# Patient Record
Sex: Female | Born: 1970 | Race: White | Hispanic: Yes | Marital: Married | Smoking: Never smoker
Health system: Southern US, Community
[De-identification: ages and names within clinical notes are randomized; demographics above are authoritative.]

## PROBLEM LIST (undated history)

## (undated) DIAGNOSIS — Z789 Other specified health status: Secondary | ICD-10-CM

## (undated) HISTORY — DX: Other specified health status: Z78.9

## (undated) HISTORY — PX: NO PAST SURGERIES: SHX2092

---

## 2004-06-11 ENCOUNTER — Emergency Department (HOSPITAL_COMMUNITY): Admission: EM | Admit: 2004-06-11 | Discharge: 2004-06-11 | Payer: Self-pay | Admitting: Emergency Medicine

## 2007-12-11 ENCOUNTER — Other Ambulatory Visit: Admission: RE | Admit: 2007-12-11 | Discharge: 2007-12-11 | Payer: Self-pay | Admitting: Obstetrics and Gynecology

## 2018-01-11 DIAGNOSIS — N6489 Other specified disorders of breast: Secondary | ICD-10-CM | POA: Insufficient documentation

## 2020-09-03 ENCOUNTER — Encounter: Payer: Self-pay | Admitting: Obstetrics and Gynecology

## 2020-09-14 NOTE — Progress Notes (Signed)
Subjective:    Mckenzie Blair - 50 y.o. female MRN [id number]  Date of birth: 06-Jan-1971  HPI  Mckenzie Blair is to establish care. Patient has a PMH significant for anemia and breast asymmetry.   Current issues and/or concerns: Reports she has an IUD in place, doing well without complications, and scheduled to be removed within the next 1 year. States IUD was placed to assist with management of heavy periods which caused anemia. Has not had period in 4 years. Would like to have hormone levels evaluated by Gynecology as she states her mother had early menopause in her early 16's. Reports if her hormone levels indicate that she is at menopause then she would not need to have IUD replaced.   ROS per HPI   Health Maintenance:  Health Maintenance Due  Topic Date Due  . Hepatitis C Screening  Never done  . HIV Screening  Never done  . COLONOSCOPY (Pts 45-49yrs Insurance coverage will need to be confirmed)  Never done     Past Medical History: Patient Active Problem List   Diagnosis Date Noted  . Anemia 09/15/2020  . Breast asymmetry 01/11/2018      Social History   reports that she has never smoked. She has never used smokeless tobacco. She reports that she does not drink alcohol and does not use drugs.   Family History  family history is not on file.   Medications: reviewed and updated   Objective:   Physical Exam BP 114/74 (BP Location: Left Arm, Patient Position: Sitting)   Pulse 82   Ht 5' 3.74" (1.619 m)   Wt 138 lb 12.8 oz (63 kg)   SpO2 97%   BMI 24.02 kg/m  Physical Exam Constitutional:      Appearance: She is normal weight.  HENT:     Head: Normocephalic.  Eyes:     Extraocular Movements: Extraocular movements intact.     Pupils: Pupils are equal, round, and reactive to light.  Cardiovascular:     Rate and Rhythm: Normal rate and regular rhythm.     Pulses: Normal pulses.     Heart sounds: Normal heart sounds.  Pulmonary:     Effort: Pulmonary effort is  normal.     Breath sounds: Normal breath sounds.  Musculoskeletal:     Cervical back: Normal range of motion and neck supple.  Neurological:     General: No focal deficit present.     Mental Status: She is alert and oriented to person, place, and time.  Psychiatric:        Mood and Affect: Mood normal.        Behavior: Behavior normal.        Assessment & Plan:  1. Encounter to establish care: - Patient presents today to establish care.  - Return in 4 to 6 weeks or sooner if needed for annual physical examination, labs, and health maintenance. Arrive fasting meaning having had no food and/or nothing to drink for at least 8 hours prior to appointment.  2. IUD check up: - Reports she has an IUD in place, doing well without complications, and scheduled to be removed within the next 1 year. States IUD was placed to assist with management of heavy periods which caused anemia. Has not had period in 4 years. Would like to have hormone levels evaluated by Gynecology as she states her mother had early menopause in her early 67's. Reports if her hormone levels indicate that she is at menopause then she  would not need to have IUD replaced. - Referral to Obstetrics / Gynecology for further evaluation and management. - Ambulatory referral to Obstetrics / Gynecology  3. Need for immunization against influenza: - Flu vaccine administered during today's visit.  - Flu Vaccine QUAD 36+ mos IM   Ricky Stabs, NP 09/16/2020, 8:21 AM Primary Care at [facility]

## 2020-09-15 ENCOUNTER — Encounter: Payer: Self-pay | Admitting: Family

## 2020-09-15 ENCOUNTER — Other Ambulatory Visit: Payer: Self-pay

## 2020-09-15 ENCOUNTER — Ambulatory Visit (INDEPENDENT_AMBULATORY_CARE_PROVIDER_SITE_OTHER): Payer: 59 | Admitting: Family

## 2020-09-15 VITALS — BP 114/74 | HR 82 | Ht 63.74 in | Wt 138.8 lb

## 2020-09-15 DIAGNOSIS — Z30431 Encounter for routine checking of intrauterine contraceptive device: Secondary | ICD-10-CM

## 2020-09-15 DIAGNOSIS — Z7689 Persons encountering health services in other specified circumstances: Secondary | ICD-10-CM

## 2020-09-15 DIAGNOSIS — D649 Anemia, unspecified: Secondary | ICD-10-CM | POA: Insufficient documentation

## 2020-09-15 DIAGNOSIS — Z0000 Encounter for general adult medical examination without abnormal findings: Secondary | ICD-10-CM

## 2020-09-15 DIAGNOSIS — Z23 Encounter for immunization: Secondary | ICD-10-CM | POA: Diagnosis not present

## 2020-09-15 NOTE — Patient Instructions (Signed)
Return in 4 to 6 weeks or sooner if needed for annual physical examination, labs, and health maintenance. Arrive fasting meaning having had no food and/or nothing to drink for at least 8 hours prior to appointment.  Referral to Obstetrics/Gynecology for IUD removal.  Thank you for choosing Primary Care at [facility] for your medical home!    Bionca Mckey was seen by Rema Fendt, NP today.   Alva Klumb's primary care provider is Rema Fendt, NP.   For the best care possible,  you should try to see Ricky Stabs, NP whenever you come to clinic.   We look forward to seeing you again soon!  If you have any questions about your visit today,  please call us at [phone]  Or feel free to reach your provider via MyChart.   Intrauterine Device Information An intrauterine device (IUD) is a medical device that is inserted into the uterus to prevent pregnancy. It is a small, T-shaped device that has one or two nylon strings hanging down from it. The strings hang out of the lower part of the uterus (cervix) to allow for future IUD removal. There are two types of IUDs:  Hormone IUD. This type of IUD is made of plastic and contains the hormone progestin (synthetic progesterone). A hormone IUD may last 3-5 years.  Copper IUD. This type of IUD has copper wire wrapped around it. A copper IUD may last up to 10 years. How is an IUD inserted? An IUD is inserted through the vagina, through the cervix, and into the uterus with a minor medical procedure. The procedure for IUD insertion may vary among health care providers and hospitals. How does an IUD work? Synthetic progesterone in a hormonal IUD prevents pregnancy by:  Thickening cervical mucus to prevent sperm from entering the uterus.  Thinning the uterine lining to prevent a fertilized egg from being implanted there. Copper in a copper IUD prevents pregnancy by making the uterus and fallopian tubes produce a fluid that kills  sperm. What are the advantages of an IUD? Advantages of either type of IUD An IUD:  Is highly effective in preventing pregnancy.  Is reversible. You can become pregnant shortly after the IUD is removed.  Is low-maintenance and can stay in place for a long time.  Has no estrogen-related side effects.  Can be used when breastfeeding.  Is not associated with weight gain.  Can be inserted right after childbirth, an abortion, or a miscarriage. Advantages of a hormone IUD  If it is inserted within 7 days of your period starting, it works right after it has been inserted. If the hormone IUD is inserted at any other time in your cycle, you will need to use a backup method of birth control for 7 days after insertion.  It can make menstrual periods lighter or stop completely.  It can reduce menstrual cramping and other discomforts from menstrual periods.  It can be used for 3-5 years, depending on which IUD you have. Advantages of a copper IUD  It works right after it is inserted.  It can be used as a form of emergency birth control if it is inserted within 5 days after having unprotected sex.  It does not interfere with your body's natural hormones.  It can be used for up to 10 years. What are the disadvantages of an IUD?  An IUD may cause irregular menstrual bleeding for a period of time after insertion.  It is common to have  pain during insertion and have cramping and vaginal bleeding after insertion.  An IUD may cut the uterus (uterine perforation) when it is inserted. This is rare.  Pelvic inflammatory disease (PID) may happen after insertion of an IUD. PID is an infection in the uterus and fallopian tubes. The IUD does not cause the infection. The infection is usually from an unknown sexually transmitted infection (STI). This is rare, and it usually happens during the first 20 days after the IUD is inserted.  A copper IUD can make your menstrual flow heavier and more  painful.  IUDs cannot prevent sexually transmitted infections (STIs). How is an IUD removed?   You will lie on your back with your knees bent and your feet in footrests (stirrups).  A device will be inserted into your vagina to spread apart the vaginal walls (speculum). This will allow your health care provider to see the strings attached to the IUD.  Your health care provider will use a small instrument (forceps) to grasp the IUD strings and will pull firmly until the IUD is removed. You may have some discomfort when the IUD is removed. Your health care provider may recommend taking over-the-counter pain relievers, such as ibuprofen, before the procedure. You may also have minor spotting for a few days after the procedure. The procedure for IUD removal may vary among health care providers and hospitals. Is an IUD right for me? If you are interested in an IUD, discuss it with your health care provider. He or she will make sure you are a good candidate for an IUD and will let you know more about the advantages, disadvantage, and possible side effects. This will allow you to make a decision about the device. Summary  An intrauterine device (IUD) is a medical device that is inserted in the uterus to prevent pregnancy. It is a small, T-shaped device that has one or two nylon strings hanging down from it.  A hormone IUD contains the hormone progestin (synthetic progesterone). A copper IUD has copper wire wrapped around it.  Synthetic progesterone in a hormone IUD prevents pregnancy by thickening cervical mucus and thinning the walls of the uterus. Copper in a copper IUD prevents pregnancy by making the uterus and fallopian tubes produce a fluid that kills sperm.  A hormone IUD can be left in place for 3-5 years. A copper IUD can be left in place for up to 10 years.  An IUD is inserted and removed by a health care provider. You may feel some pain during insertion and removal. Your health care  provider may recommend taking over-the-counter pain medicine, such as ibuprofen, before an IUD procedure. This information is not intended to replace advice given to you by your health care provider. Make sure you discuss any questions you have with your health care provider. Document Revised: 02/20/2020 Document Reviewed: 02/20/2020 Elsevier Patient Education  2021 [employer].

## 2020-09-15 NOTE — Progress Notes (Signed)
Establish care

## 2020-10-21 ENCOUNTER — Ambulatory Visit (INDEPENDENT_AMBULATORY_CARE_PROVIDER_SITE_OTHER): Payer: 59 | Admitting: Family

## 2020-10-21 ENCOUNTER — Other Ambulatory Visit: Payer: Self-pay

## 2020-10-21 ENCOUNTER — Encounter: Payer: Self-pay | Admitting: Family

## 2020-10-21 VITALS — BP 102/56 | HR 85 | Ht 63.74 in | Wt 137.2 lb

## 2020-10-21 DIAGNOSIS — E785 Hyperlipidemia, unspecified: Secondary | ICD-10-CM

## 2020-10-21 DIAGNOSIS — Z131 Encounter for screening for diabetes mellitus: Secondary | ICD-10-CM

## 2020-10-21 DIAGNOSIS — Z130 Encounter for screening for diseases of the blood and blood-forming organs and certain disorders involving the immune mechanism: Secondary | ICD-10-CM | POA: Diagnosis not present

## 2020-10-21 DIAGNOSIS — Z13228 Encounter for screening for other metabolic disorders: Secondary | ICD-10-CM

## 2020-10-21 DIAGNOSIS — Z1159 Encounter for screening for other viral diseases: Secondary | ICD-10-CM

## 2020-10-21 DIAGNOSIS — Z1211 Encounter for screening for malignant neoplasm of colon: Secondary | ICD-10-CM

## 2020-10-21 DIAGNOSIS — G5602 Carpal tunnel syndrome, left upper limb: Secondary | ICD-10-CM

## 2020-10-21 DIAGNOSIS — Z114 Encounter for screening for human immunodeficiency virus [HIV]: Secondary | ICD-10-CM

## 2020-10-21 DIAGNOSIS — H6121 Impacted cerumen, right ear: Secondary | ICD-10-CM

## 2020-10-21 DIAGNOSIS — Z1329 Encounter for screening for other suspected endocrine disorder: Secondary | ICD-10-CM

## 2020-10-21 DIAGNOSIS — Z0000 Encounter for general adult medical examination without abnormal findings: Secondary | ICD-10-CM | POA: Diagnosis not present

## 2020-10-21 DIAGNOSIS — Z13220 Encounter for screening for lipoid disorders: Secondary | ICD-10-CM

## 2020-10-21 MED ORDER — GABAPENTIN 100 MG PO CAPS: 100.0000 mg | ORAL_CAPSULE | Freq: Every day | ORAL | 0 refills | Status: DC

## 2020-10-21 MED ORDER — DEBROX 6.5 % OT SOLN: 5.0000 [drp] | Freq: Two times a day (BID) | OTIC | 0 refills | Status: DC

## 2020-10-21 NOTE — Progress Notes (Addendum)
Patient ID: Mckenzie Blair, female    DOB: 1970-12-15  MRN: [id number]  CC: Annual Physical Exam  Subjective: Mckenzie Blair is a 50 y.o. female who presents for annual physical exam.  Her concerns today include:   1. 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 Tylenol and Advil does help some. Using wrist splint. Is painful. No swelling. With bending the left thumb it does radiate to the wrist.   2. RIGHT EAR DISCOMFORT: Says she can hear her voice sometimes inside of her ear from the ear discomfort. Does use Q-tips. Denies pain and drainage.  Patient Active Problem List   Diagnosis Date Noted  . Anemia 09/15/2020  . Breast asymmetry 01/11/2018     Current Outpatient Medications on File Prior to Visit  Medication Sig Dispense Refill  . cetirizine-pseudoephedrine (ZYRTEC-D) 5-120 MG tablet Take by mouth.    . levonorgestrel (MIRENA) 20 MCG/24HR IUD by Intrauterine route.     No current facility-administered medications on file prior to visit.    No Known Allergies  Social History   Socioeconomic History  . Marital status: Married    Spouse name: Not on file  . Number of children: Not on file  . Years of education: Not on file  . Highest education level: Not on file  Occupational History  . Not on file  Tobacco Use  . Smoking status: Never Smoker  . Smokeless tobacco: Never Used  Vaping Use  . Vaping Use: Never used  Substance and Sexual Activity  . Alcohol use: Never  . Drug use: Never  . Sexual activity: Yes    Birth control/protection: I.U.D.  Other Topics Concern  . Not on file  Social History Narrative  . Not on file   Social Determinants of Health   Financial Resource Strain: Not on file  Food Insecurity: Not on file  Transportation Needs: Not on file  Physical Activity: Not on file  Stress: Not on file  Social Connections: Not on file  Intimate Partner  Violence: Not on file    History reviewed. No pertinent family history.  Past Surgical History:  Procedure Laterality Date  . NO PAST SURGERIES      ROS: Review of Systems Negative except as stated above  PHYSICAL EXAM: BP (!) 102/56 (BP Location: Left Arm, Patient Position: Sitting)   Pulse 85   Ht 5' 3.74" (1.619 m)   Wt 137 lb 3.2 oz (62.2 kg)   SpO2 98%   BMI 23.74 kg/m    Wt Readings from Last 3 Encounters:  10/21/20 137 lb 3.2 oz (62.2 kg)  09/15/20 138 lb 12.8 oz (63 kg)   Physical Exam Constitutional:      Appearance: She is normal weight.  HENT:     Head: Normocephalic and atraumatic.     Right Ear: Tympanic membrane normal. There is impacted cerumen.     Left Ear: Tympanic membrane, ear canal and external ear normal.     Nose: Nose normal.     Mouth/Throat:     Mouth: Mucous membranes are moist.     Pharynx: Oropharynx is clear. No oropharyngeal exudate.  Eyes:     Extraocular Movements: Extraocular movements intact.     Conjunctiva/sclera: Conjunctivae normal.     Pupils: Pupils are equal, round, and reactive to light.  Cardiovascular:     Rate and Rhythm: Normal rate and regular  rhythm.     Pulses: Normal pulses.     Heart sounds: Normal heart sounds.  Pulmonary:     Effort: Pulmonary effort is normal.     Breath sounds: Normal breath sounds.  Chest:  Breasts:     Right: Normal.     Left: Normal.      Comments: Margorie John, CMA present during examination.  Abdominal:     General: Abdomen is flat. Bowel sounds are normal.     Palpations: Abdomen is soft.  Genitourinary:    Comments: Patient declined examination.  Musculoskeletal:        General: Normal range of motion.     Left hand: Tenderness present.     Cervical back: Normal range of motion and neck supple.     Comments: Tenderness located at the left thumb palmar aspect.  Skin:    General: Skin is warm and dry.     Capillary Refill: Capillary refill takes less than 2 seconds.   Neurological:     General: No focal deficit present.     Mental Status: She is alert and oriented to person, place, and time.  Psychiatric:        Mood and Affect: Mood normal.        Behavior: Behavior normal.    ASSESSMENT AND PLAN: 1. Annual physical exam: - Counseled on 150 minutes of exercise per week as tolerated, healthy eating (including decreased daily intake of saturated fats, cholesterol, added sugars, sodium), STI prevention, and routine healthcare maintenance.  2. Screening for metabolic disorder: - CMP to check kidney function, liver function, and electrolyte balance.  - Comprehensive metabolic panel  3. Screening for deficiency anemia: - CBC to screen for anemia. - CBC  4. Diabetes mellitus screening: - Hemoglobin A1c to screen for pre-diabetes/diabetes. - Hemoglobin A1c  5. Screening cholesterol level: - Lipid panel to screen for high cholesterol.  - Lipid Panel  6. Thyroid disorder screen: - TSH to check thyroid function.  - TSH+T4F+T3Free  7. Need for hepatitis C screening test: - HCV antibody to screen for hepatitis C.  - HCV Ab w/Rflx to Verification  8. Encounter for screening for HIV: - HIV antibody to screen for human immunodeficiency virus.  - HIV antibody (with reflex)  9. Colon cancer screening: - Patient declined colonoscopy.  - Patient agreeable to Cologuard testing for colon cancer screening.   10. Carpal tunnel syndrome of left wrist: - Left thumb and wrist discomfort likely related to carpal tunnel syndrome. - Continue over-the-counter Tylenol and Advil.  - Continue wrist splint.  - Gabapentin as prescribed.  - Follow-up in 1 month or sooner if needed with primary provider.  - gabapentin (NEURONTIN) 100 MG capsule; Take 1 capsule (100 mg total) by mouth at bedtime.  Dispense: 30 capsule; Refill: 0  11. Right ear impacted cerumen: - Carbamide Peroxide as prescribed.  - Return in 1 week or sooner if needed for irrigation with the  nurse.   - carbamide peroxide (DEBROX) 6.5 % OTIC solution; Place 5 drops into the right ear 2 (two) times daily. Do not use more than 4 days in a row.  Dispense: 15 mL; Refill: 0  Patient was given the opportunity to ask questions.  Patient verbalized understanding of the plan and was able to repeat key elements of the plan. Patient was given clear instructions to go to Emergency Department or return to medical center if symptoms don't improve, worsen, or new problems develop.The patient verbalized understanding.   Orders  Placed This Encounter  Procedures  . Comprehensive metabolic panel  . CBC  . Hemoglobin A1c  . Lipid Panel  . TSH+T4F+T3Free  . HCV Ab w/Rflx to Verification  . HIV antibody (with reflex)  . Ambulatory referral to Gastroenterology     Requested Prescriptions   Signed Prescriptions Disp Refills  . gabapentin (NEURONTIN) 100 MG capsule 30 capsule 0    Sig: Take 1 capsule (100 mg total) by mouth at bedtime.  . carbamide peroxide (DEBROX) 6.5 % OTIC solution 15 mL 0    Sig: Place 5 drops into the right ear 2 (two) times daily. Do not use more than 4 days in a row.    Return in about 4 weeks (around 11/18/2020) for Follow-Up carpal tunnel, and 1 week for nurse only visit right ear irrigation.  Rema Fendt, NP

## 2020-10-21 NOTE — Progress Notes (Signed)
Physical Left thumb pain when bending backwards Feels like water in right ear

## 2020-10-21 NOTE — Patient Instructions (Addendum)
Annual physical exam and labs today. Will call with results.   Gabapentin for carpal tunnel syndrome. Continue Tylenol and Advil over-the-counter as well. Follow-up in 1 month.   Debrox ear drops for right ear wax. Return in 1 week for nurse visit to irrigate right ear.   Cologuard test for colon cancer screening.   Follow-up with primary provider as needed.   Preventive Care 50-50 Years Old, Female Preventive care refers to lifestyle choices and visits with your health care provider that can promote health and wellness. This includes:  A yearly physical exam. This is also called an annual wellness visit.  Regular dental and eye exams.  Immunizations.  Screening for certain conditions.  Healthy lifestyle choices, such as: ? Eating a healthy diet. ? Getting regular exercise. ? Not using drugs or products that contain nicotine and tobacco. ? Limiting alcohol use. What can I expect for my preventive care visit? Physical exam Your health care provider will check your:  Height and weight. These may be used to calculate your BMI (body mass index). BMI is a measurement that tells if you are at a healthy weight.  Heart rate and blood pressure.  Body temperature.  Skin for abnormal spots. Counseling Your health care provider may ask you questions about your:  Past medical problems.  Family's medical history.  Alcohol, tobacco, and drug use.  Emotional well-being.  Home life and relationship well-being.  Sexual activity.  Diet, exercise, and sleep habits.  Work and work Statistician.  Access to firearms.  Method of birth control.  Menstrual cycle.  Pregnancy history. What immunizations do I need? Vaccines are usually given at various ages, according to a schedule. Your health care provider will recommend vaccines for you based on your age, medical history, and lifestyle or other factors, such as travel or where you work.   What tests do I need? Blood  tests  Lipid and cholesterol levels. These may be checked every 5 years, or more often if you are over 50 years old.  Hepatitis C test.  Hepatitis B test. Screening  Lung cancer screening. You may have this screening every year starting at age 50 if you have a 30-pack-year history of smoking and currently smoke or have quit within the past 15 years.  Colorectal cancer screening. ? All adults should have this screening starting at age 50 and continuing until age 73. ? Your health care provider may recommend screening at age 74 if you are at increased risk. ? You will have tests every 1-10 years, depending on your results and the type of screening test.  Diabetes screening. ? This is done by checking your blood sugar (glucose) after you have not eaten for a while (fasting). ? You may have this done every 1-3 years.  Mammogram. ? This may be done every 1-2 years. ? Talk with your health care provider about when you should start having regular mammograms. This may depend on whether you have a family history of breast cancer.  BRCA-related cancer screening. This may be done if you have a family history of breast, ovarian, tubal, or peritoneal cancers.  Pelvic exam and Pap test. ? This may be done every 3 years starting at age 50. ? Starting at age 50, this may be done every 5 years if you have a Pap test in combination with an HPV test. Other tests  STD (sexually transmitted disease) testing, if you are at risk.  Bone density scan. This is done to screen  for osteoporosis. You may have this scan if you are at high risk for osteoporosis. Talk with your health care provider about your test results, treatment options, and if necessary, the need for more tests. Follow these instructions at home: Eating and drinking  Eat a diet that includes fresh fruits and vegetables, whole grains, lean protein, and low-fat dairy products.  Take vitamin and mineral supplements as recommended by your  health care provider.  Do not drink alcohol if: ? Your health care provider tells you not to drink. ? You are pregnant, may be pregnant, or are planning to become pregnant.  If you drink alcohol: ? Limit how much you have to 0-1 drink a day. ? Be aware of how much alcohol is in your drink. In the [place] one drink equals one 12 oz bottle of beer (355 mL), one 5 oz glass of wine (148 mL), or one 1 oz glass of hard liquor (44 mL).   Lifestyle  Take daily care of your teeth and gums. Brush your teeth every morning and night with fluoride toothpaste. Floss one time each day.  Stay active. Exercise for at least 30 minutes 5 or more days each week.  Do not use any products that contain nicotine or tobacco, such as cigarettes, e-cigarettes, and chewing tobacco. If you need help quitting, ask your health care provider.  Do not use drugs.  If you are sexually active, practice safe sex. Use a condom or other form of protection to prevent STIs (sexually transmitted infections).  If you do not wish to become pregnant, use a form of birth control. If you plan to become pregnant, see your health care provider for a prepregnancy visit.  If told by your health care provider, take low-dose aspirin daily starting at age 50.  Find healthy ways to cope with stress, such as: ? Meditation, yoga, or listening to music. ? Journaling. ? Talking to a trusted person. ? Spending time with friends and family. Safety  Always wear your seat belt while driving or riding in a vehicle.  Do not drive: ? If you have been drinking alcohol. Do not ride with someone who has been drinking. ? When you are tired or distracted. ? While texting.  Wear a helmet and other protective equipment during sports activities.  If you have firearms in your house, make sure you follow all gun safety procedures. What's next?  Visit your health care provider once a year for an annual wellness visit.  Ask your health care  provider how often you should have your eyes and teeth checked.  Stay up to date on all vaccines. This information is not intended to replace advice given to you by your health care provider. Make sure you discuss any questions you have with your health care provider. Document Revised: 05/13/2020 Document Reviewed: 04/20/2018 Elsevier Patient Education  2021 [employer].

## 2020-10-22 LAB — COMPREHENSIVE METABOLIC PANEL
ALT: 19 IU/L (ref 0–32)
AST: 19 IU/L (ref 0–40)
Albumin/Globulin Ratio: 1.7 (ref 1.2–2.2)
Albumin: 4.8 g/dL (ref 3.8–4.8)
Alkaline Phosphatase: 99 IU/L (ref 44–121)
BUN/Creatinine Ratio: 15 (ref 9–23)
BUN: 11 mg/dL (ref 6–24)
Bilirubin Total: 0.5 mg/dL (ref 0.0–1.2)
CO2: 24 mmol/L (ref 20–29)
Calcium: 10.2 mg/dL (ref 8.7–10.2)
Chloride: 102 mmol/L (ref 96–106)
Creatinine, Ser: 0.71 mg/dL (ref 0.57–1.00)
Globulin, Total: 2.9 g/dL (ref 1.5–4.5)
Glucose: 78 mg/dL (ref 65–99)
Potassium: 4.2 mmol/L (ref 3.5–5.2)
Sodium: 140 mmol/L (ref 134–144)
Total Protein: 7.7 g/dL (ref 6.0–8.5)
eGFR: 104 mL/min/{1.73_m2} (ref >59)

## 2020-10-22 LAB — HCV INTERPRETATION

## 2020-10-22 LAB — CBC
Hematocrit: 46.2 % (ref 34.0–46.6)
Hemoglobin: 15.3 g/dL (ref 11.1–15.9)
MCH: 29.8 pg (ref 26.6–33.0)
MCHC: 33.1 g/dL (ref 31.5–35.7)
MCV: 90 fL (ref 79–97)
Platelets: 334 10*3/uL (ref 150–450)
RBC: 5.13 x10E6/uL (ref 3.77–5.28)
RDW: 12 % (ref 11.7–15.4)
WBC: 4.6 10*3/uL (ref 3.4–10.8)

## 2020-10-22 LAB — LIPID PANEL
Chol/HDL Ratio: 3.6 ratio (ref 0.0–4.4)
Cholesterol, Total: 229 mg/dL — ABNORMAL HIGH (ref 100–199)
HDL: 63 mg/dL (ref >39)
LDL Chol Calc (NIH): 140 mg/dL — ABNORMAL HIGH (ref 0–99)
Triglycerides: 146 mg/dL (ref 0–149)
VLDL Cholesterol Cal: 26 mg/dL (ref 5–40)

## 2020-10-22 LAB — TSH+T4F+T3FREE
Free T4: 1.1 ng/dL (ref 0.82–1.77)
T3, Free: 3.6 pg/mL (ref 2.0–4.4)
TSH: 1.35 u[IU]/mL (ref 0.450–4.500)

## 2020-10-22 LAB — HEMOGLOBIN A1C
Est. average glucose Bld gHb Est-mCnc: 117 mg/dL
Hgb A1c MFr Bld: 5.7 % — ABNORMAL HIGH (ref 4.8–5.6)

## 2020-10-22 LAB — HIV ANTIBODY (ROUTINE TESTING W REFLEX): HIV Screen 4th Generation wRfx: NONREACTIVE

## 2020-10-22 LAB — HCV AB W/RFLX TO VERIFICATION: HCV Ab: <0.1 s/co ratio (ref 0.0–0.9)

## 2020-10-23 DIAGNOSIS — E785 Hyperlipidemia, unspecified: Secondary | ICD-10-CM | POA: Insufficient documentation

## 2020-10-23 MED ORDER — ATORVASTATIN CALCIUM 20 MG PO TABS: 20.0000 mg | ORAL_TABLET | Freq: Every day | ORAL | 0 refills | Status: DC

## 2020-10-23 NOTE — Progress Notes (Signed)
Please call patient with update.   Kidney function normal.   Liver function normal.   Thyroid normal.  No anemia.  Hepatitis C negative.   HIV negative.   Your hemoglobin A1c is consistent with pre-diabetes. Practice healthy eating habits of fresh fruit and vegetables, lean baked meats such as chicken, fish, and [place]; limit breads, rice, pastas, and desserts; practice regular aerobic exercise (at least 150 minutes a week as tolerated) and will recheck in 6 months.  Cholesterol higher than expected. High cholesterol may increase risk of heart attack and/or stroke. Consider eating more fruits, vegetables, and lean baked meats such as chicken or fish. Moderate intensity exercise at least 150 minutes as tolerated per week may help as well.   Atorvastatin 20 mg daily prescribed for high cholesterol. Will recheck in 3 months. Please schedule lab appointment while patient is on the phone.

## 2020-10-23 NOTE — Addendum Note (Signed)
Addended by: Rema Fendt on: 10/23/2020 04:27 PM   Modules accepted: Orders

## 2020-10-23 NOTE — Addendum Note (Signed)
Addended by: Rema Fendt on: 10/23/2020 04:22 PM   Modules accepted: Orders

## 2020-10-28 ENCOUNTER — Ambulatory Visit: Payer: 59

## 2020-10-28 ENCOUNTER — Other Ambulatory Visit: Payer: Self-pay

## 2020-10-28 DIAGNOSIS — H6121 Impacted cerumen, right ear: Secondary | ICD-10-CM | POA: Insufficient documentation

## 2020-10-28 NOTE — Progress Notes (Signed)
Right ear irrigation. Pt verbalized she feels better

## 2020-11-13 ENCOUNTER — Other Ambulatory Visit: Payer: Self-pay

## 2020-11-13 ENCOUNTER — Ambulatory Visit (INDEPENDENT_AMBULATORY_CARE_PROVIDER_SITE_OTHER): Payer: Self-pay

## 2020-11-13 VITALS — BP 113/59 | HR 78 | Ht 64.0 in | Wt 137.4 lb

## 2020-11-13 DIAGNOSIS — Z30431 Encounter for routine checking of intrauterine contraceptive device: Secondary | ICD-10-CM

## 2020-11-13 NOTE — Progress Notes (Signed)
   GYNECOLOGY PROBLEM OFFICE VISIT NOTE  History:  Mckenzie Blair is a 50 y.o. who is here today for "I don't know."  She states that she referred by her primary care doctor for "checking my blood to figure out if I am in menopause."  She reports "occasional hot flashes and mostly at night, but not everyday." She denies bleeding and an improvement in her iron level as she states her IUD was placed for abnormal bleeding.  She endorses that her IUD was inserted Feb 2018 and questions when it is to be removed. Patient denies any abnormal vaginal discharge, bleeding, pelvic pain or other concerns.   She reports having a pap smear that was normal in May 2021, Mammogram last year, and is scheduled to receive a colon screen through her PCP.    Past Medical History:  Diagnosis Date  . No pertinent past medical history     Past Surgical History:  Procedure Laterality Date  . NO PAST SURGERIES      The following portions of the patient's history were reviewed and updated as appropriate: allergies, current medications, past family history, past medical history, past social history, past surgical history and problem list.   Health Maintenance:  Normal pap and negative HRHPV on May 2021.  Normal mammogram in 2021 after having biospy in 2019.   Review of Systems:  Genito-Urinary ROS: no dysuria, trouble voiding, or hematuria Gastrointestinal ROS: no abdominal pain, change in bowel habits, or black or bloody stools Objective:  Vitals: BP (!) 113/59   Pulse 78   Ht 5\' 4"  (1.626 m)   Wt 137 lb 6.4 oz (62.3 kg)   BMI 23.58 kg/m   Physical Exam: Physical Exam Constitutional:      Appearance: Normal appearance.  HENT:     Head: Normocephalic and atraumatic.  Eyes:     Conjunctiva/sclera: Conjunctivae normal.  Cardiovascular:     Rate and Rhythm: Regular rhythm.     Heart sounds: Normal heart sounds.  Pulmonary:     Effort: Pulmonary effort is normal. No respiratory distress.     Breath  sounds: Normal breath sounds.  Abdominal:     General: Bowel sounds are normal.     Palpations: Abdomen is soft.     Tenderness: There is no abdominal tenderness.  Musculoskeletal:        General: Normal range of motion.     Cervical back: Normal range of motion.  Neurological:     Mental Status: She is alert and oriented to person, place, and time.  Skin:    General: Skin is warm and dry.  Psychiatric:        Mood and Affect: Mood normal.        Behavior: Behavior normal.        Thought Content: Thought content normal.  Vitals reviewed.      Labs and Imaging: No results found.  Assessment & Plan:  50 year old Mirena IUD PeriMenopausal  -Informed that IUD is now good for 7 years. However, if she desires removal after 5 that would not occur until Feb of 2023. -Provider offers speculum exam to review IUD strings.  Patient declines.  -Patient states she is comfortable with leaving in place for 7 years. -Instructed to return if abnormal bleeding occurs, abdominal pain, or perimenopausal symptoms worsen. -Patient verbalizes understanding and without further questions or concerns.    Total face-to-face time with patient: 15 minutes   10-05-1980, Gerrit Heck 11/13/2020 9:13 AM

## 2020-11-13 NOTE — Patient Instructions (Signed)
[employer] Textbook of Endocrinology (14th ed., pp. 574-641). [place], [place]: Elsevier.">  Perimenopause Perimenopause is the normal time of a woman's life when the levels of estrogen, the female hormone produced by the ovaries, begin to decrease. This leads to changes in menstrual periods before they stop completely (menopause). Perimenopause can begin 2-8 years before menopause. During perimenopause, the ovaries may or may not produce an egg and a woman can still become pregnant. What are the causes? This condition is caused by a natural change in hormone levels that happens as you get older. What increases the risk? This condition is more likely to start at an earlier age if you have certain medical conditions or have undergone treatments, including:  A tumor of the pituitary gland in the brain.  A disease that affects the ovaries and hormone production.  Certain cancer treatments, such as chemotherapy or hormone therapy, or radiation therapy on the pelvis.  Heavy smoking and excessive alcohol use.  Family history of early menopause. What are the signs or symptoms? Perimenopausal changes affect each woman differently. Symptoms of this condition may include:  Hot flashes.  Irregular menstrual periods.  Night sweats.  Changes in feelings about sex. This could be a decrease in sex drive or an increased discomfort around your sexuality.  Vaginal dryness.  Headaches.  Mood swings.  Depression.  Problems sleeping (insomnia).  Memory problems or trouble concentrating.  Irritability.  Tiredness.  Weight gain.  Anxiety.  Trouble getting pregnant. How is this diagnosed? This condition is diagnosed based on your medical history, a physical exam, your age, your menstrual history, and your symptoms. Hormone tests may also be done. How is this treated? In some cases, no treatment is needed. You and your health care provider should make a decision together about whether  treatment is necessary. Treatment will be based on your individual condition and preferences. Various treatments are available, such as:  Menopausal hormone therapy (MHT).  Medicines to treat specific symptoms.  Acupuncture.  Vitamin or herbal supplements. Before starting treatment, make sure to let your health care provider know if you have a personal or family history of:  Heart disease.  Breast cancer.  Blood clots.  Diabetes.  Osteoporosis. Follow these instructions at home: Medicines  Take over-the-counter and prescription medicines only as told by your health care provider.  Take vitamin supplements only as told by your health care provider.  Talk with your health care provider before starting any herbal supplements. Lifestyle  Do not use any products that contain nicotine or tobacco, such as cigarettes, e-cigarettes, and chewing tobacco. If you need help quitting, ask your health care provider.  Get at least 30 minutes of physical activity on 5 or more days each week.  Eat a balanced diet that includes fresh fruits and vegetables, whole grains, soybeans, eggs, lean meat, and low-fat dairy.  Avoid alcoholic and caffeinated beverages, as well as spicy foods. This may help prevent hot flashes.  Get 7-8 hours of sleep each night.  Dress in layers that can be removed to help you manage hot flashes.  Find ways to manage stress, such as deep breathing, meditation, or journaling.   General instructions  Keep track of your menstrual periods, including: ? When they occur. ? How heavy they are and how long they last. ? How much time passes between periods.  Keep track of your symptoms, noting when they start, how often you have them, and how long they last.  Use vaginal lubricants or moisturizers to help   with vaginal dryness and improve comfort during sex.  You can still become pregnant if you are having irregular periods. Make sure you use contraception during  perimenopause if you do not want to get pregnant.  Keep all follow-up visits. This is important. This includes any group therapy or counseling.   Contact a health care provider if:  You have heavy vaginal bleeding or pass blood clots.  Your period lasts more than 2 days longer than normal.  Your periods are recurring sooner than 21 days.  You bleed after having sex.  You have pain during sex. Get help right away if you have:  Chest pain, trouble breathing, or trouble talking.  Severe depression.  Pain when you urinate.  Severe headaches.  Vision problems. Summary  Perimenopause is the time when a woman's body begins to move into menopause. This may happen naturally or as a result of other health problems or medical treatments.  Perimenopause can begin 2-8 years before menopause, and it can last for several years.  Perimenopausal symptoms can be managed through medicines, lifestyle changes, and complementary therapies such as acupuncture. This information is not intended to replace advice given to you by your health care provider. Make sure you discuss any questions you have with your health care provider. Document Revised: 01/24/2020 Document Reviewed: 01/24/2020 Elsevier Patient Education  2021 Elsevier Inc.  

## 2020-11-21 ENCOUNTER — Ambulatory Visit: Payer: 59 | Admitting: Family

## 2020-11-21 ENCOUNTER — Encounter: Payer: Self-pay | Admitting: Family

## 2020-11-21 ENCOUNTER — Encounter: Payer: 59 | Admitting: Family

## 2020-11-21 NOTE — Progress Notes (Signed)
Patient did not show for appointment.   

## 2021-01-23 ENCOUNTER — Other Ambulatory Visit: Payer: 59

## 2021-02-09 NOTE — Progress Notes (Signed)
Patient ID: Mckenzie Blair, female    DOB: 02-15-1971  MRN: [id number]  CC: Right Knee Pain   Subjective: Mckenzie Blair is a 50 y.o. female who presents for right knee pain.   Her concerns today include:   1. 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     levonorgestrel (MIRENA) 20 MCG/24HR IUD by Intrauterine route.     atorvastatin (LIPITOR) 20 MG tablet Take 1 tablet (20 mg total) by mouth daily. (Patient not taking: No sig reported) 90 tablet 0   carbamide peroxide (DEBROX) 6.5 % OTIC solution Place 5 drops into the right ear 2 (two) times daily. Do not use more than 4 days in a row. (Patient not taking: No sig reported) 15 mL 0   gabapentin (NEURONTIN) 100 MG capsule Take 1 capsule (100 mg total) by mouth at bedtime. (Patient not taking: No sig reported) 30 capsule 0   No current facility-administered medications on file prior to visit.    No Known Allergies  Social History   Socioeconomic History   Marital status: Married    Spouse name: Not on file   Number of children: Not  on file   Years of education: Not on file   Highest education level: Not on file  Occupational History   Not on file  Tobacco Use   Smoking status: Never   Smokeless tobacco: Never  Vaping Use   Vaping Use: Never used  Substance and Sexual Activity   Alcohol use: Never   Drug use: Never   Sexual activity: Yes    Birth control/protection: I.U.D.  Other Topics Concern   Not on file  Social History Narrative   Not on file   Social Determinants of Health   Financial Resource Strain: Not on file  Food Insecurity: No Food Insecurity   Worried About Running Out of Food in the Last Year: Never true   Ran Out of Food in the Last Year: Never true  Transportation Needs: Not on file  Physical Activity: Not on file  Stress: Not on file  Social Connections: Not on file  Intimate Partner Violence: Not on file    History reviewed. No pertinent family history.  Past Surgical History:  Procedure Laterality Date   NO PAST SURGERIES      ROS: Review of Systems Negative except as stated above  PHYSICAL EXAM: BP 103/73 (BP Location: Left Arm, Patient Position: Sitting, Cuff Size: Normal)   Pulse 83   Temp 98.1 F (36.7 C)   Resp 14   Ht 5' 3.74" (1.619 m)   Wt 134 lb 12.8 oz (61.1 kg)  SpO2 98%   BMI 23.33 kg/m   Physical Exam General appearance - alert, well appearing, and in no distress and oriented to person, place, and time Chest - clear to auscultation, no wheezes, rales or rhonchi, symmetric air entry, no tachypnea, retractions or cyanosis Heart - normal rate, regular rhythm, normal S1, S2, no murmurs, rubs, clicks or gallops Neurological - alert, oriented, normal speech, no focal findings or movement disorder noted Musculoskeletal - lateral aspect right knee with mild edema Extremities - peripheral pulses normal, no pedal edema, no clubbing or cyanosis Skin - normal coloration and turgor, no rashes, no suspicious skin lesions noted  ASSESSMENT AND PLAN: 1. Acute  pain of right knee: - Ibuprofen as prescribed.  - Diagnostic right knee x-ray for further evaluation. Patient plans to return within 2 weeks to have this completed if still having symptoms.  - Follow-up with primary provider as scheduled.  - ibuprofen (ADVIL) 600 MG tablet; Take 1 tablet (600 mg total) by mouth at bedtime.  Dispense: 30 tablet; Refill: 0 - DG Knee Complete 4 Views Right; Future   Patient was given the opportunity to ask questions.  Patient verbalized understanding of the plan and was able to repeat key elements of the plan. Patient was given clear instructions to go to Emergency Department or return to medical center if symptoms don't improve, worsen, or new problems develop.The patient verbalized understanding.   Orders Placed This Encounter  Procedures   DG Knee Complete 4 Views Right     Requested Prescriptions   Signed Prescriptions Disp Refills   ibuprofen (ADVIL) 600 MG tablet 30 tablet 0    Sig: Take 1 tablet (600 mg total) by mouth at bedtime.   Follow-up with primary provider as scheduled.   Rema Fendt, NP

## 2021-02-10 ENCOUNTER — Ambulatory Visit (INDEPENDENT_AMBULATORY_CARE_PROVIDER_SITE_OTHER): Payer: 59 | Admitting: Family

## 2021-02-10 ENCOUNTER — Other Ambulatory Visit: Payer: Self-pay

## 2021-02-10 ENCOUNTER — Encounter: Payer: Self-pay | Admitting: Family

## 2021-02-10 ENCOUNTER — Ambulatory Visit: Payer: 59

## 2021-02-10 VITALS — BP 103/73 | HR 83 | Temp 98.1°F | Resp 14 | Ht 63.74 in | Wt 134.8 lb

## 2021-02-10 DIAGNOSIS — M25561 Pain in right knee: Secondary | ICD-10-CM | POA: Diagnosis not present

## 2021-02-10 MED ORDER — IBUPROFEN 600 MG PO TABS: 600.0000 mg | ORAL_TABLET | Freq: Every day | ORAL | 0 refills | Status: AC

## 2021-02-10 NOTE — Progress Notes (Signed)
Pt presents today for right knee pain symptoms started approx 2 weeks ago, no trauma to knee, pt states that knee aches after a day of activity

## 2021-03-13 ENCOUNTER — Other Ambulatory Visit: Payer: 59

## 2021-03-13 ENCOUNTER — Other Ambulatory Visit: Payer: Self-pay

## 2021-03-13 ENCOUNTER — Encounter: Payer: Self-pay | Admitting: Family

## 2021-03-13 ENCOUNTER — Ambulatory Visit (INDEPENDENT_AMBULATORY_CARE_PROVIDER_SITE_OTHER): Payer: 59 | Admitting: Family

## 2021-03-13 VITALS — BP 100/68 | HR 70 | Temp 98.1°F | Resp 15 | Ht 64.02 in | Wt 134.8 lb

## 2021-03-13 DIAGNOSIS — M25561 Pain in right knee: Secondary | ICD-10-CM | POA: Diagnosis not present

## 2021-03-13 DIAGNOSIS — M25461 Effusion, right knee: Secondary | ICD-10-CM

## 2021-03-13 NOTE — Progress Notes (Signed)
Pt presents for right knee edema, noticed swelling last Tuesday, has always had mild pain with right knee but now notices swelling

## 2021-03-13 NOTE — Progress Notes (Signed)
Mckenzie Blair, is a 50 y.o. female  YNW:[id number]  MVH:[id number]  DOB - 1971/06/30  Subjective:  Chief Complaint and HPI: Mckenzie Blair is a 50 y.o. female here today t with complaints of pain and swelling to her right knee.  Last seen at the clinic on February 10, 2021 by her primary care provider with complaints of pain to the right knee patient says that she was prescribed anti-inflammatory medications which is helping with her pain management.  On Tuesday of this week, patient realized that her right knee was swelling.  No pain today.  She denies numbness or tingling to the right lower extremity no pain to lower back her right knee does not lock.  Patient says she never had any injury to the right knee before and she never experienced similar symptoms before.  Denies chest pain, shortness of breath, fever, chills or any other symptoms done above.  ED/Hospital notes reviewed.    ROS:   Constitutional:  No f/c, No night sweats, No unexplained weight loss. Respiratory: No cough, No SOB Cardiac: No CP, no palpitations GI:  No abd pain, No N/V/D. GU: No Urinary s/sx Musculoskeletal: Intermittent pain to the right knee , mostly when she flex and extends her right foot.  Also swelling to the right knee  neuro: No headache, no dizziness, no motor weakness.  Psych: Denies SI/HI  No problems updated.  ALLERGIES: No Known Allergies  PAST MEDICAL HISTORY: Past Medical History:  Diagnosis Date   No pertinent past medical history     MEDICATIONS AT HOME: Prior to Admission medications   Medication Sig Start Date End Date Taking? Authorizing Provider  atorvastatin (LIPITOR) 20 MG tablet Take 1 tablet (20 mg total) by mouth daily. Patient not taking: No sig reported 10/23/20   Rema Fendt, NP  carbamide peroxide (DEBROX) 6.5 % OTIC solution Place 5 drops into the right ear 2 (two) times daily. Do not use more than 4 days in a row. Patient not taking: No sig reported 10/21/20   Rema Fendt, NP  cetirizine-pseudoephedrine (ZYRTEC-D) 5-120 MG tablet Take by mouth.    [provider]  gabapentin (NEURONTIN) 100 MG capsule Take 1 capsule (100 mg total) by mouth at bedtime. Patient not taking: No sig reported 10/21/20   Rema Fendt, NP  levonorgestrel [facility]) 20 MCG/24HR IUD by Intrauterine route. 10/04/16 10/04/21  [provider]     Objective:  EXAM:   Vitals:   03/13/21 1036  BP: 100/68  Pulse: 70  Resp: 15  Temp: 98.1 F (36.7 C)  SpO2: 98%  Weight: 134 lb 12.8 oz (61.1 kg)  Height: 5' 4.02" (1.626 m)    General appearance : A&OX3. NAD. Non-toxic-appearing Chest/Lungs:  Breathing-non-labored, Good air entry bilaterally, breath sounds normal without rales, rhonchi, or wheezing  CVS: S1 S2 regular, no murmurs, gallops, rubs  Abdomen: Bowel sounds present, Non tender and not distended with no gaurding, rigidity or rebound. Extremities: Bilateral Lower Ext shows no edema, both legs are warm to touch with = pulse throughout.  Patient complains of pain with extension and flexion of the right knee,  pain lateral side of right knee with flexion and extension of the right foot Neurology:  CN II-XII grossly intact, Non focal.   Psych:  TP linear. J/I WNL. Normal speech. Appropriate eye contact and affect.  Skin: Skin is intact and warm to touch with no sign of infection  Data Review Lab Results  Component Value Date  HGBA1C 5.7 (H) 10/21/2020     Assessment & Plan   1. Pain and swelling of right knee -X-ray of the right knee today -Continue taking ibuprofen or Tylenol as needed -Report new or worsening symptoms to the clinic or local emergency room. -Continue to use knee brace as needed. -Verbalized understanding to all the instructions and all her questions were answered- -PT referral initiated.     Patient have been counseled extensively about nutrition and exercise  No follow-ups on file.  The patient was given clear  instructions to go to ER or return to medical center if symptoms don't improve, worsen or new problems develop. The patient verbalized understanding. The patient was told to call to get lab results if they haven't heard anything in the next week.     Eleonore Chiquito, FNP-C [facility] and [facility] [place], [place] [phone]   03/13/2021, 11:08 AM

## 2021-03-19 ENCOUNTER — Encounter: Payer: Self-pay | Admitting: Family

## 2021-04-20 ENCOUNTER — Encounter: Payer: Self-pay | Admitting: Family

## 2021-04-21 ENCOUNTER — Telehealth: Payer: Self-pay | Admitting: Family

## 2021-04-21 NOTE — Telephone Encounter (Signed)
Pt called in wants a tb test however was positive by test did xray was negative so pt wants to skip tb test and do x-ray is what I was told please advise

## 2021-04-22 ENCOUNTER — Other Ambulatory Visit: Payer: Self-pay | Admitting: Family

## 2021-04-22 ENCOUNTER — Other Ambulatory Visit: Payer: Self-pay

## 2021-04-22 ENCOUNTER — Ambulatory Visit: Payer: 59

## 2021-04-22 ENCOUNTER — Ambulatory Visit (INDEPENDENT_AMBULATORY_CARE_PROVIDER_SITE_OTHER): Payer: 59

## 2021-04-22 DIAGNOSIS — Z111 Encounter for screening for respiratory tuberculosis: Secondary | ICD-10-CM

## 2021-04-22 DIAGNOSIS — Z9289 Personal history of other medical treatment: Secondary | ICD-10-CM | POA: Diagnosis not present

## 2021-04-22 NOTE — Progress Notes (Signed)
Patient declined PPD, history of false positive result.

## 2021-04-24 NOTE — Progress Notes (Signed)
Normal chest x-ray.

## 2021-04-28 ENCOUNTER — Telehealth: Payer: Self-pay | Admitting: Family

## 2021-04-28 NOTE — Telephone Encounter (Signed)
Pt called about  Screening examination for pulmonary tuberculosis   Wanting the forms back and needing them for work please assist I could not find anything in pick up box ready for pt pt last seen 04/22/21

## 2021-04-29 ENCOUNTER — Encounter: Payer: Self-pay | Admitting: Family

## 2021-07-01 ENCOUNTER — Other Ambulatory Visit: Payer: Self-pay

## 2021-07-01 ENCOUNTER — Ambulatory Visit (INDEPENDENT_AMBULATORY_CARE_PROVIDER_SITE_OTHER): Payer: 59

## 2021-07-01 DIAGNOSIS — Z23 Encounter for immunization: Secondary | ICD-10-CM | POA: Diagnosis not present

## 2021-07-01 NOTE — Progress Notes (Signed)
Influenza vaccination administered left deltoid, pt tolerated injection well

## 2021-10-19 ENCOUNTER — Other Ambulatory Visit: Payer: Self-pay

## 2021-10-19 ENCOUNTER — Encounter: Payer: Self-pay | Admitting: Obstetrics and Gynecology

## 2021-10-19 ENCOUNTER — Ambulatory Visit (INDEPENDENT_AMBULATORY_CARE_PROVIDER_SITE_OTHER): Payer: 59 | Admitting: Obstetrics and Gynecology

## 2021-10-19 DIAGNOSIS — Z30431 Encounter for routine checking of intrauterine contraceptive device: Secondary | ICD-10-CM | POA: Diagnosis not present

## 2021-10-19 NOTE — Progress Notes (Signed)
Mckenzie Blair presents to discuss her IUD. She had Mirena IUD placed 09/2016 for contraception and cycle control. The office thqat placed the IUD called her and told her it was time to have it removed. She reports doing well with her IUD.  PE AF VSS Lungs clear Heart RRR Abd soft + BS   A/P IUD check  Pt declined string check. Pt informed that Mirena IUD is now approved for 8 yrs. She will f/u PRN

## 2021-10-19 NOTE — Patient Instructions (Signed)

## 2022-05-14 IMAGING — DX DG KNEE COMPLETE 4+V*R*
4 series · 4 of 4 positions shown · non-contrast
Comparison: None.

CLINICAL DATA: Right knee pain

EXAM:
RIGHT KNEE - COMPLETE 4+ VIEW

[knee ap]
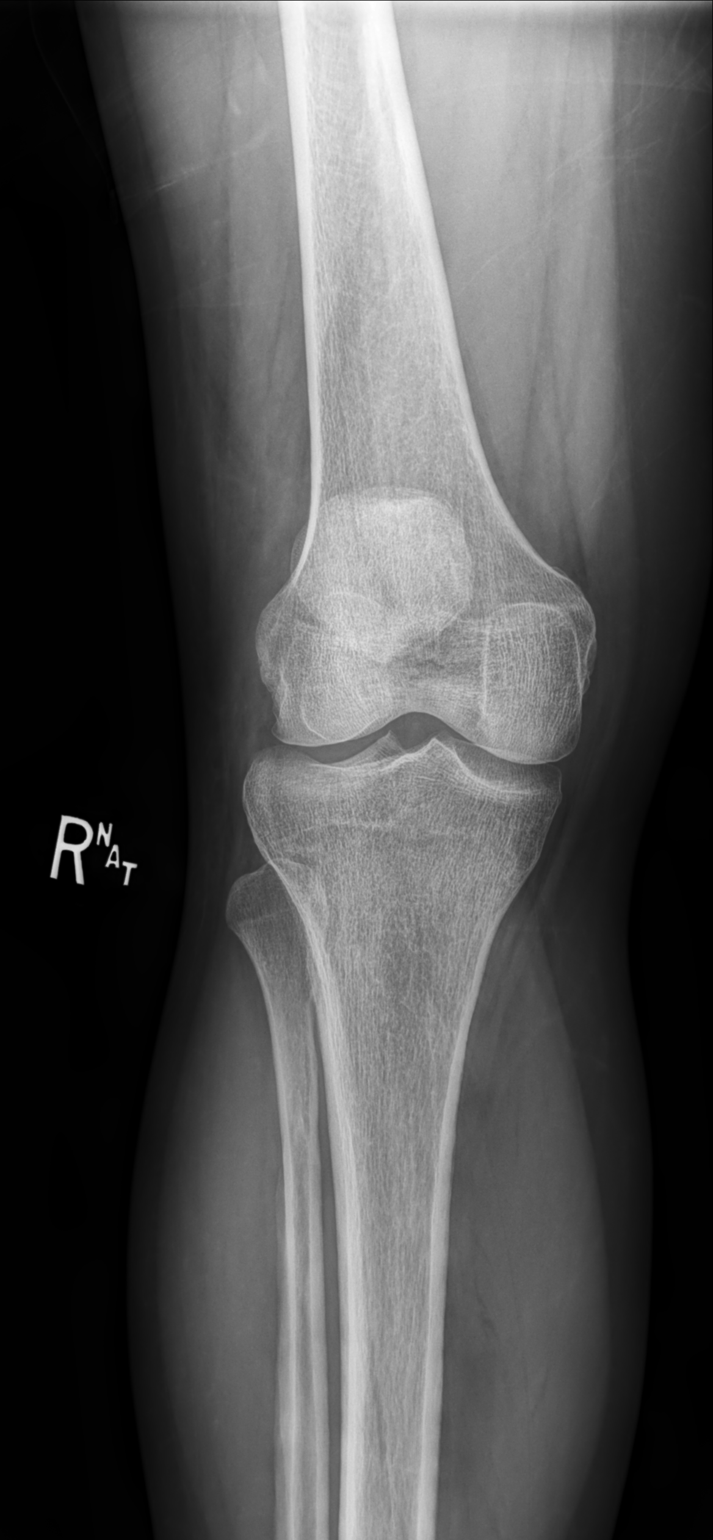

[knee lmo]
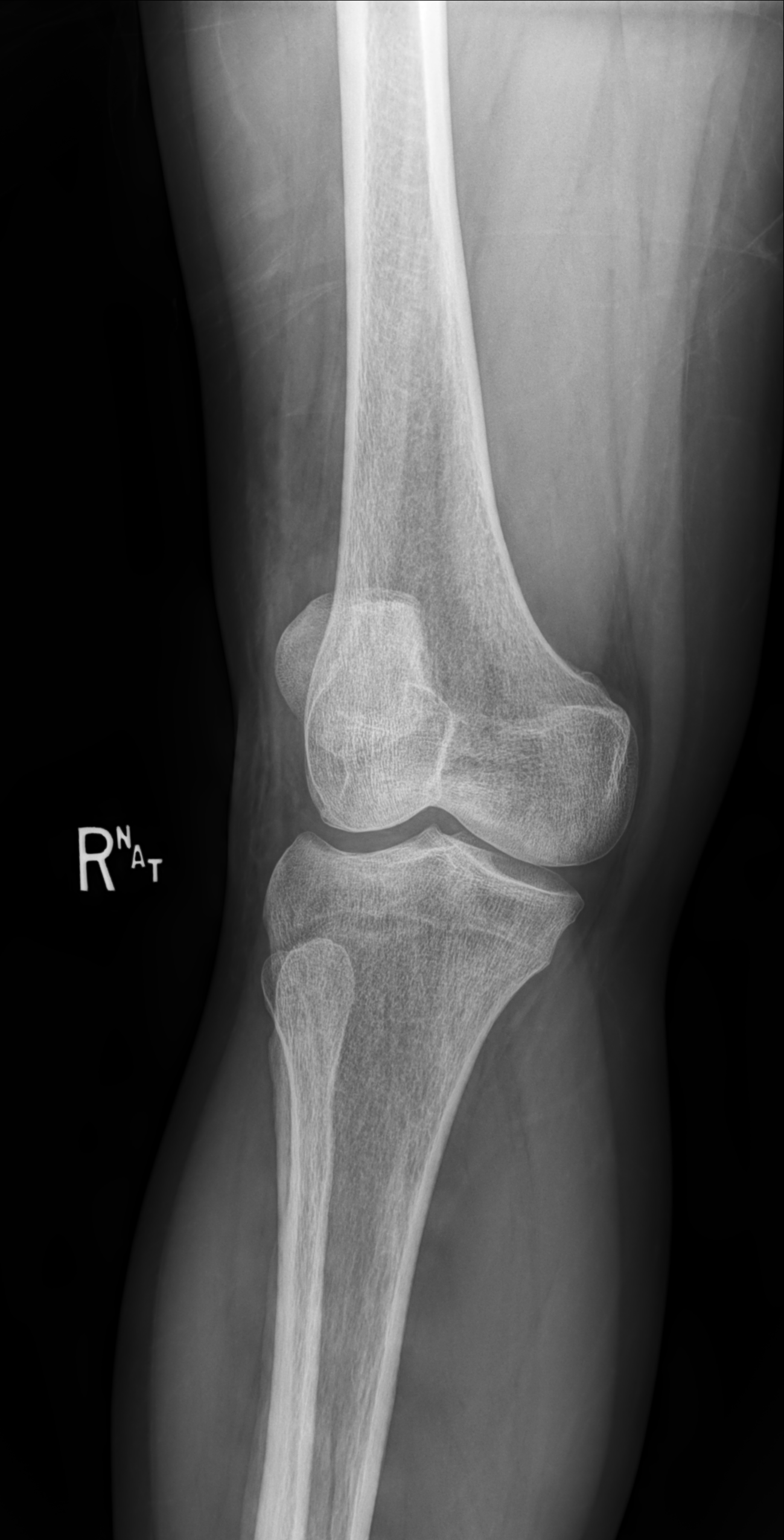

[knee mlo]
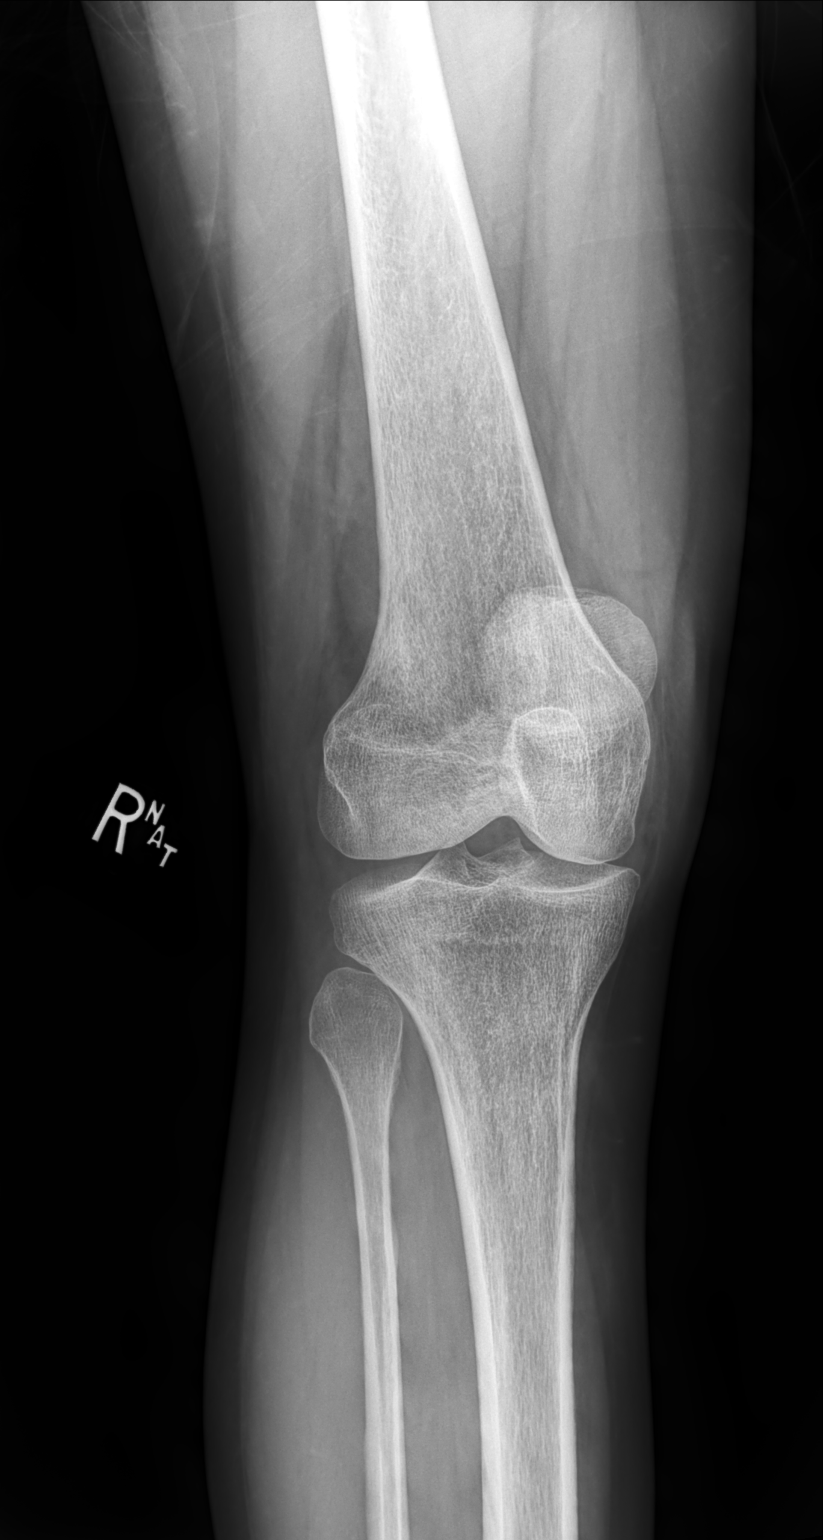

[knee lat]
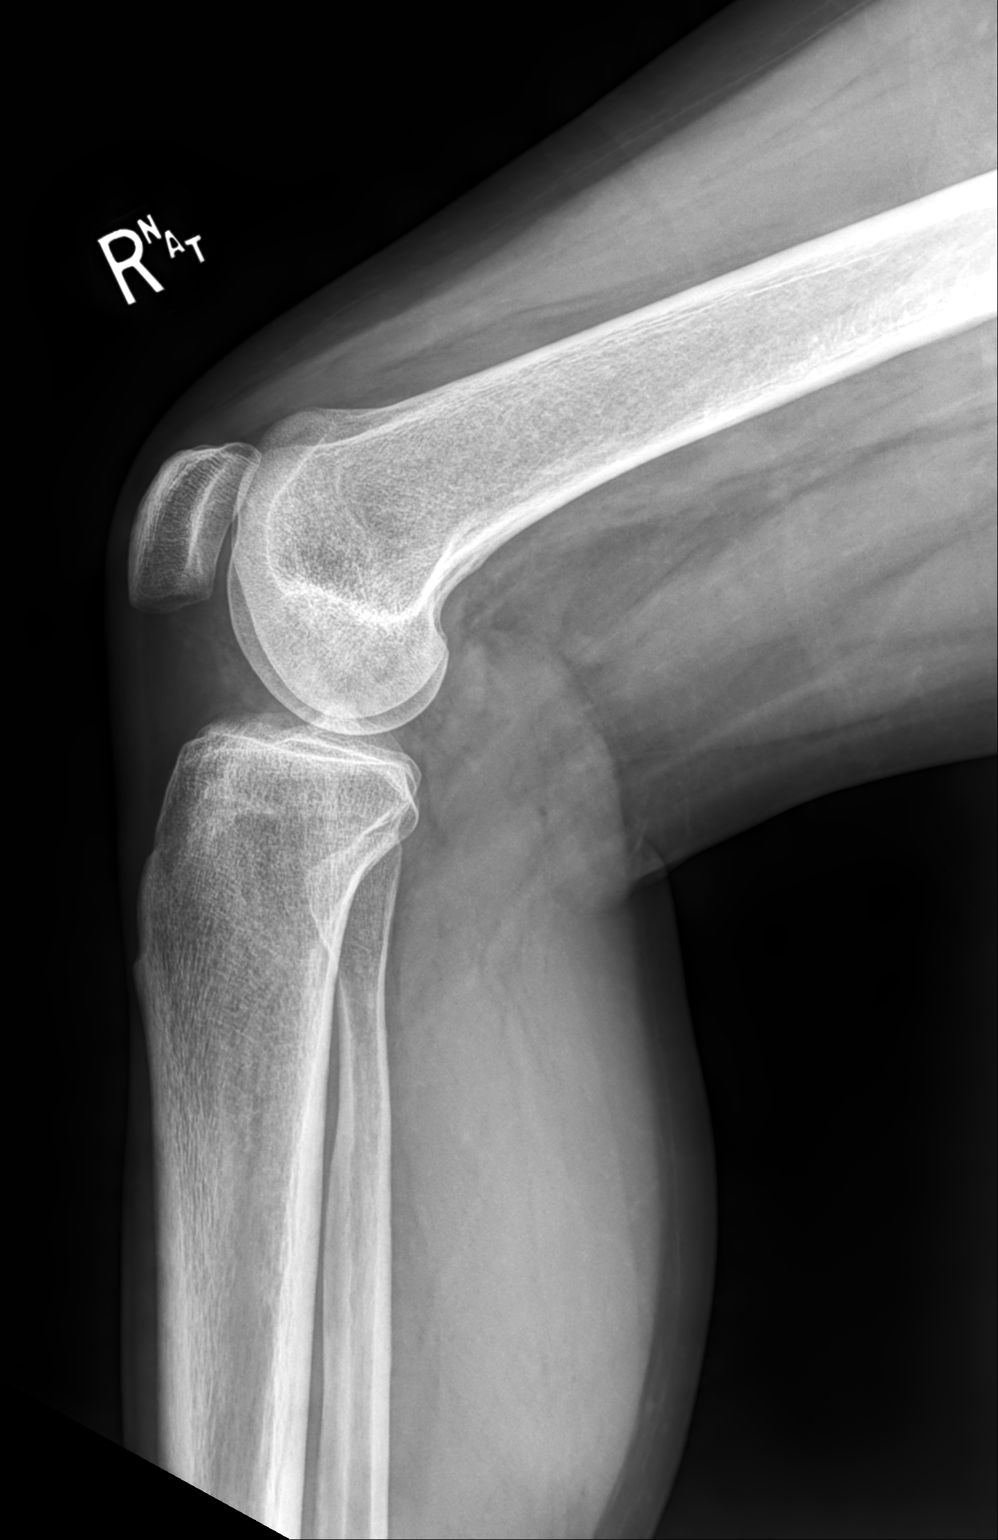

[4 of 4 positions shown; findings below may reference images not displayed]

FINDINGS: No evidence of fracture, dislocation, or joint effusion. No evidence
of arthropathy or other focal bone abnormality. Soft tissues are
unremarkable.
IMPRESSION: No acute fracture or dislocation.

## 2022-06-23 IMAGING — DX DG CHEST 2V
2 series · 2 of 2 positions shown · non-contrast
Comparison: March 15, 2018

CLINICAL DATA: History of TB.

EXAM:
CHEST - 2 VIEW

[chest pa]
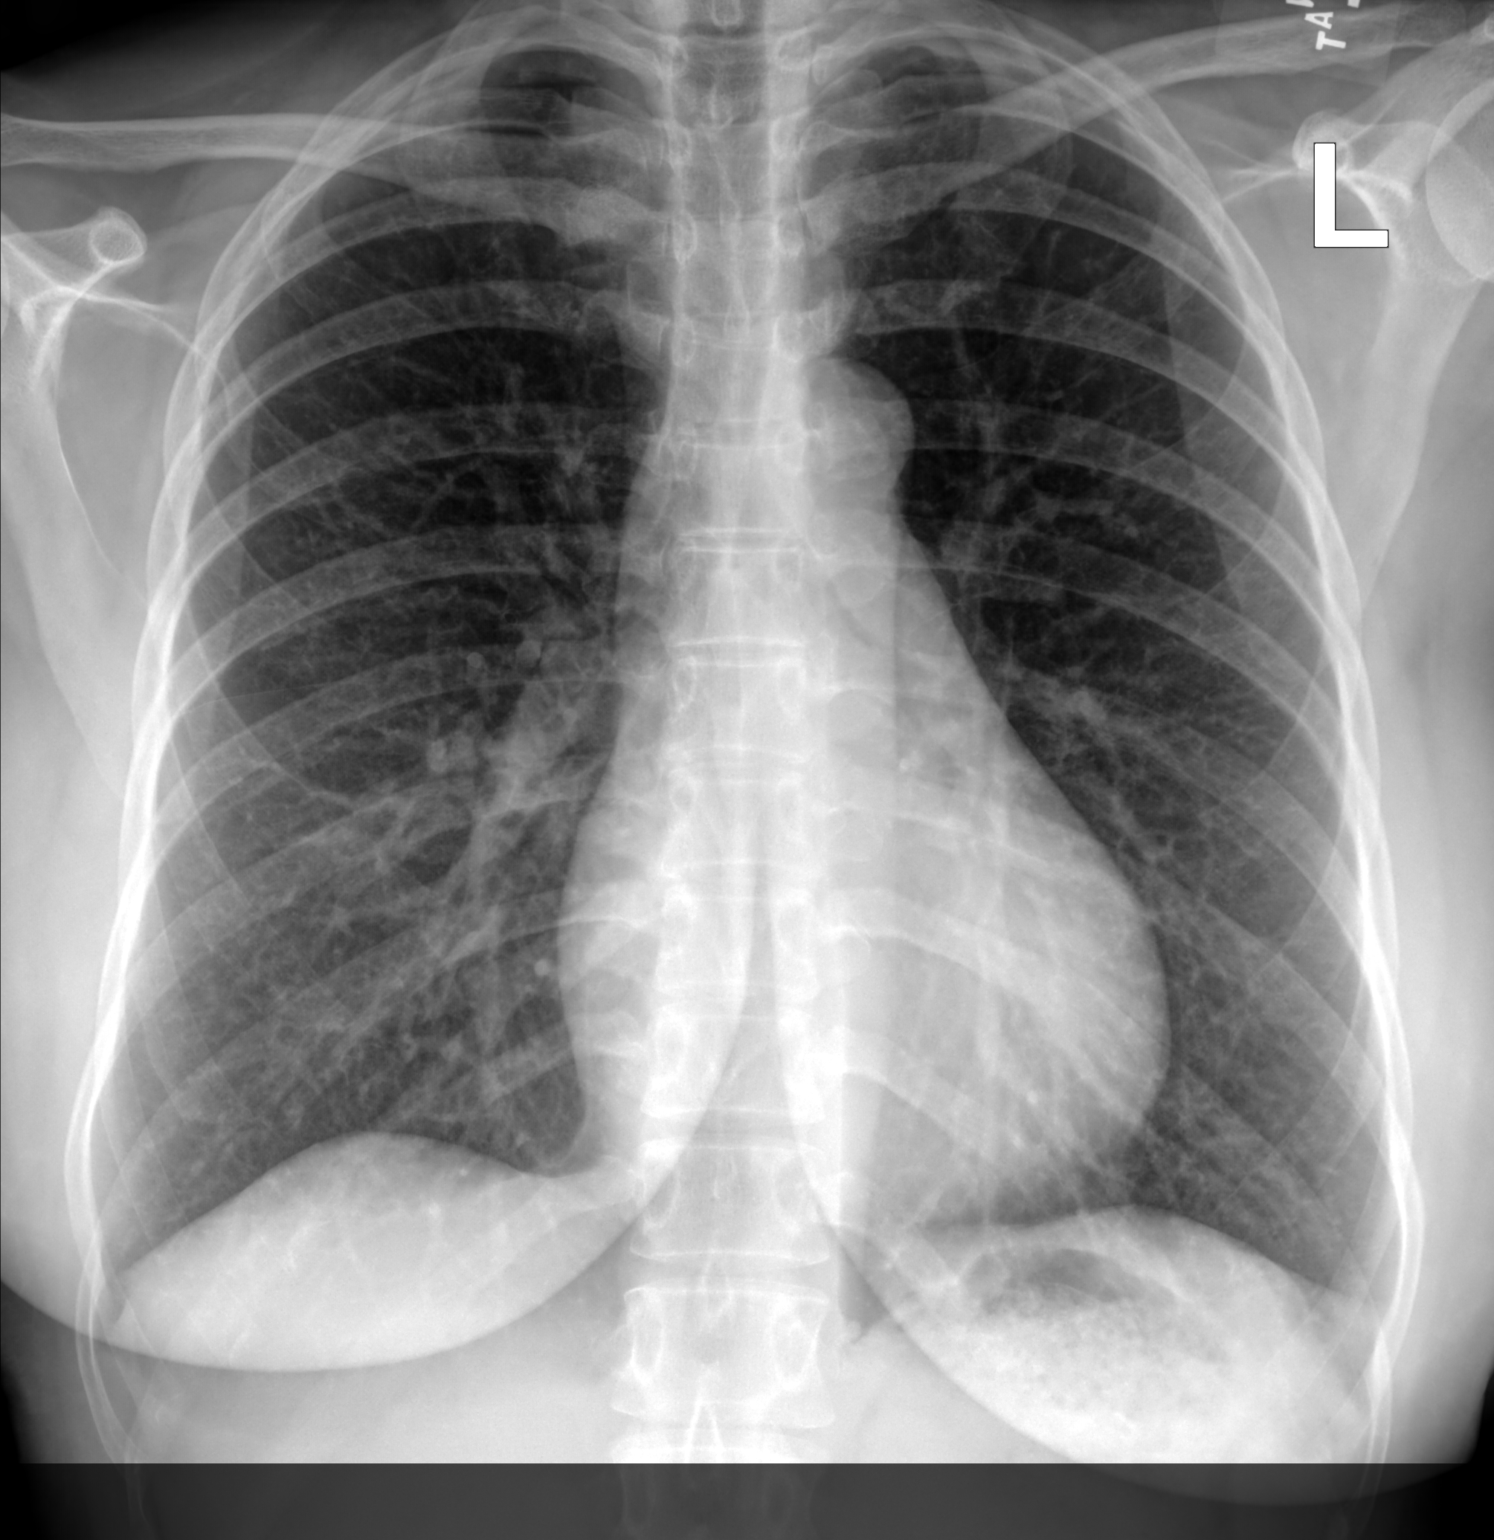

[chest lat]
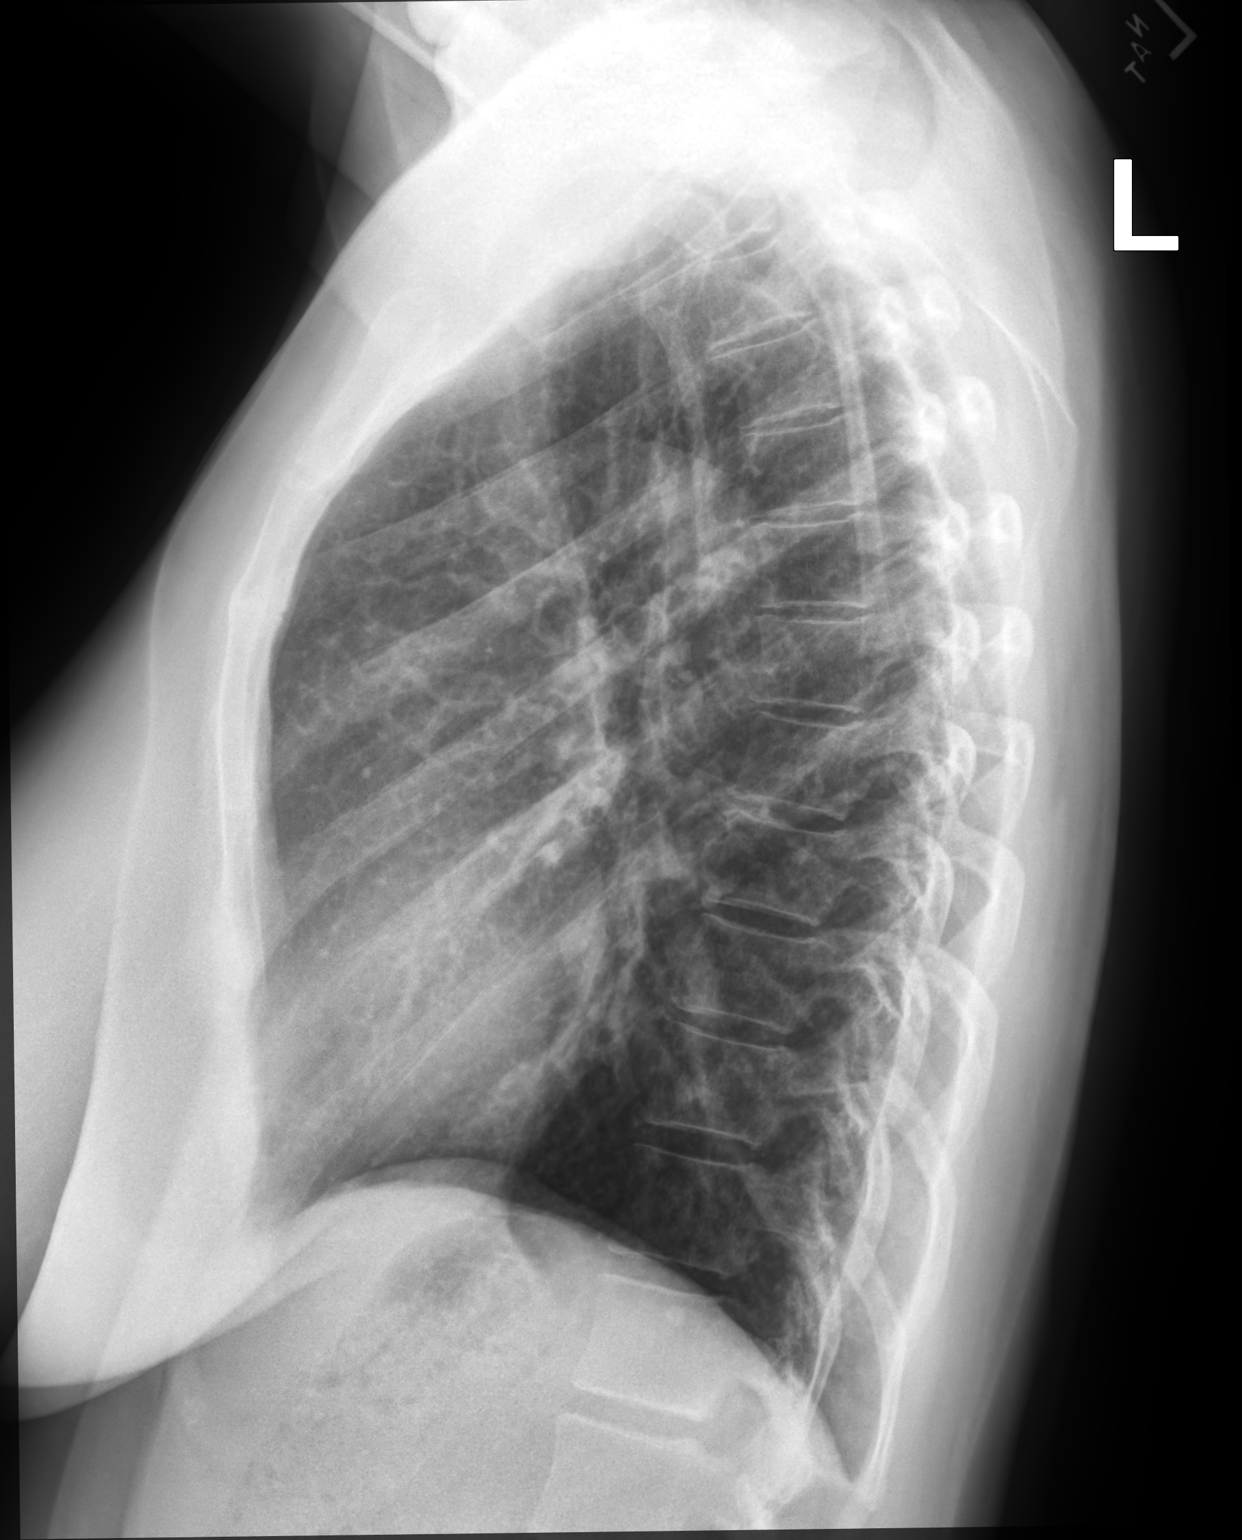

[2 of 2 positions shown; findings below may reference images not displayed]

FINDINGS: There is no evidence of acute infiltrate, pleural effusion or
pneumothorax. The heart size and mediastinal contours are within
normal limits. The visualized skeletal structures are unremarkable.
IMPRESSION: No active cardiopulmonary disease.
